# Patient Record
Sex: Male | Born: 1985 | Race: White | Hispanic: No | Marital: Single | Smoking: Never smoker
Health system: Southern US, Community
[De-identification: ages and names within clinical notes are randomized; demographics above are authoritative.]

## PROBLEM LIST (undated history)

## (undated) DIAGNOSIS — Z8489 Family history of other specified conditions: Secondary | ICD-10-CM

## (undated) HISTORY — PX: ESOPHAGOGASTRODUODENOSCOPY: SHX1529

## (undated) HISTORY — DX: Family history of other specified conditions: Z84.89

---
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
## 2010-06-16 ENCOUNTER — Ambulatory Visit
Admission: RE | Admit: 2010-06-16 | Discharge: 2010-06-16 | Disposition: A | Discharge status: Home / Self Care | Payer: Self-pay | Source: Ambulatory Visit | Attending: Occupational Medicine | Admitting: Occupational Medicine

## 2010-06-16 ENCOUNTER — Other Ambulatory Visit: Payer: Self-pay | Admitting: Occupational Medicine

## 2010-06-16 DIAGNOSIS — Z021 Encounter for pre-employment examination: Secondary | ICD-10-CM

## 2013-01-03 IMAGING — CR DG CHEST 1V
1 series · 1 of 1 positions shown · non-contrast
Comparison: None.

CLINICAL DATA: Pre employment exam, nonsmoker, no symptoms.

CHEST - 1 VIEW

[view not recorded]
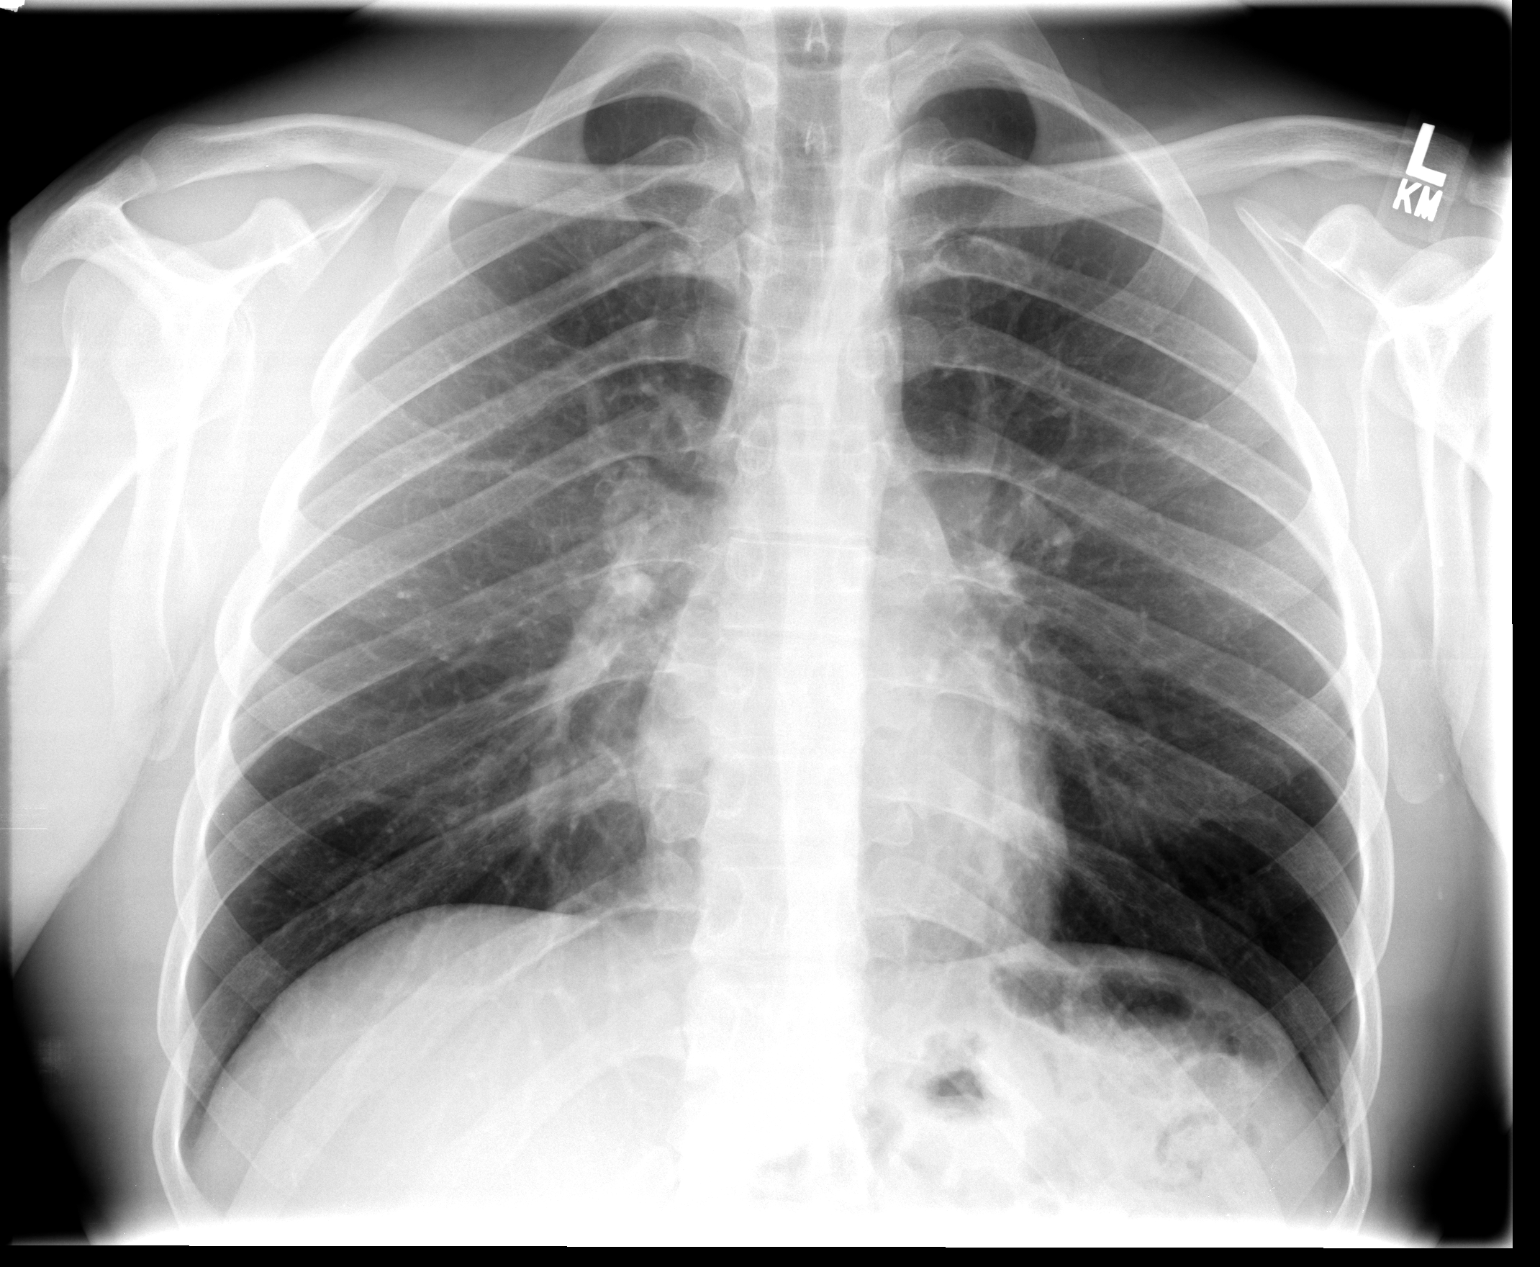

[1 of 1 positions shown; findings below may reference images not displayed]

FINDINGS: Lungs are clear.

Heart size normal.

Mediastinum, hila, pleura and osseous structures appear normal.
IMPRESSION: Normal.

## 2020-02-12 DIAGNOSIS — K223 Perforation of esophagus: Secondary | ICD-10-CM | POA: Diagnosis not present

## 2020-02-12 DIAGNOSIS — T18128A Food in esophagus causing other injury, initial encounter: Secondary | ICD-10-CM | POA: Diagnosis not present

## 2020-02-12 DIAGNOSIS — J029 Acute pharyngitis, unspecified: Secondary | ICD-10-CM | POA: Diagnosis not present

## 2020-02-12 DIAGNOSIS — N281 Cyst of kidney, acquired: Secondary | ICD-10-CM | POA: Diagnosis not present

## 2020-02-12 DIAGNOSIS — K3189 Other diseases of stomach and duodenum: Secondary | ICD-10-CM | POA: Diagnosis not present

## 2020-02-12 DIAGNOSIS — J982 Interstitial emphysema: Secondary | ICD-10-CM | POA: Diagnosis not present

## 2020-02-12 DIAGNOSIS — R109 Unspecified abdominal pain: Secondary | ICD-10-CM | POA: Diagnosis not present

## 2020-02-12 DIAGNOSIS — R188 Other ascites: Secondary | ICD-10-CM | POA: Diagnosis not present

## 2020-02-12 DIAGNOSIS — J90 Pleural effusion, not elsewhere classified: Secondary | ICD-10-CM | POA: Diagnosis not present

## 2020-02-12 DIAGNOSIS — K429 Umbilical hernia without obstruction or gangrene: Secondary | ICD-10-CM | POA: Diagnosis not present

## 2020-02-13 DIAGNOSIS — K920 Hematemesis: Secondary | ICD-10-CM | POA: Diagnosis not present

## 2020-02-13 DIAGNOSIS — J90 Pleural effusion, not elsewhere classified: Secondary | ICD-10-CM | POA: Diagnosis not present

## 2020-02-13 DIAGNOSIS — Z720 Tobacco use: Secondary | ICD-10-CM | POA: Diagnosis not present

## 2020-02-13 DIAGNOSIS — R000 Tachycardia, unspecified: Secondary | ICD-10-CM | POA: Diagnosis not present

## 2020-02-13 DIAGNOSIS — J939 Pneumothorax, unspecified: Secondary | ICD-10-CM | POA: Diagnosis not present

## 2020-02-13 DIAGNOSIS — J948 Other specified pleural conditions: Secondary | ICD-10-CM | POA: Diagnosis not present

## 2020-02-13 DIAGNOSIS — Z792 Long term (current) use of antibiotics: Secondary | ICD-10-CM | POA: Diagnosis not present

## 2020-02-13 DIAGNOSIS — R079 Chest pain, unspecified: Secondary | ICD-10-CM | POA: Diagnosis not present

## 2020-02-13 DIAGNOSIS — R109 Unspecified abdominal pain: Secondary | ICD-10-CM | POA: Diagnosis not present

## 2020-02-13 DIAGNOSIS — R0789 Other chest pain: Secondary | ICD-10-CM | POA: Diagnosis not present

## 2020-02-13 DIAGNOSIS — R6521 Severe sepsis with septic shock: Secondary | ICD-10-CM | POA: Diagnosis not present

## 2020-02-13 DIAGNOSIS — K223 Perforation of esophagus: Secondary | ICD-10-CM | POA: Diagnosis not present

## 2020-02-13 DIAGNOSIS — J9811 Atelectasis: Secondary | ICD-10-CM | POA: Diagnosis not present

## 2020-02-13 DIAGNOSIS — R918 Other nonspecific abnormal finding of lung field: Secondary | ICD-10-CM | POA: Diagnosis not present

## 2020-02-13 DIAGNOSIS — Z20822 Contact with and (suspected) exposure to covid-19: Secondary | ICD-10-CM | POA: Diagnosis not present

## 2020-02-13 DIAGNOSIS — R1013 Epigastric pain: Secondary | ICD-10-CM | POA: Diagnosis not present

## 2020-02-13 DIAGNOSIS — I10 Essential (primary) hypertension: Secondary | ICD-10-CM | POA: Diagnosis not present

## 2020-02-13 DIAGNOSIS — J029 Acute pharyngitis, unspecified: Secondary | ICD-10-CM | POA: Diagnosis not present

## 2020-02-13 DIAGNOSIS — T18128A Food in esophagus causing other injury, initial encounter: Secondary | ICD-10-CM | POA: Diagnosis not present

## 2020-02-13 DIAGNOSIS — J9851 Mediastinitis: Secondary | ICD-10-CM | POA: Diagnosis not present

## 2020-02-13 DIAGNOSIS — R404 Transient alteration of awareness: Secondary | ICD-10-CM | POA: Diagnosis not present

## 2020-02-13 DIAGNOSIS — Z4682 Encounter for fitting and adjustment of non-vascular catheter: Secondary | ICD-10-CM | POA: Diagnosis not present

## 2020-02-13 DIAGNOSIS — Z6825 Body mass index (BMI) 25.0-25.9, adult: Secondary | ICD-10-CM | POA: Diagnosis not present

## 2020-02-13 DIAGNOSIS — N179 Acute kidney failure, unspecified: Secondary | ICD-10-CM | POA: Diagnosis not present

## 2020-02-13 DIAGNOSIS — G8918 Other acute postprocedural pain: Secondary | ICD-10-CM | POA: Diagnosis not present

## 2020-02-13 DIAGNOSIS — A419 Sepsis, unspecified organism: Secondary | ICD-10-CM | POA: Diagnosis not present

## 2020-02-13 DIAGNOSIS — Z0389 Encounter for observation for other suspected diseases and conditions ruled out: Secondary | ICD-10-CM | POA: Diagnosis not present

## 2020-02-13 DIAGNOSIS — J982 Interstitial emphysema: Secondary | ICD-10-CM | POA: Diagnosis not present

## 2020-02-13 DIAGNOSIS — R739 Hyperglycemia, unspecified: Secondary | ICD-10-CM | POA: Diagnosis not present

## 2020-02-25 DIAGNOSIS — K223 Perforation of esophagus: Secondary | ICD-10-CM | POA: Diagnosis not present

## 2020-02-25 DIAGNOSIS — J90 Pleural effusion, not elsewhere classified: Secondary | ICD-10-CM | POA: Diagnosis not present

## 2020-03-03 DIAGNOSIS — K223 Perforation of esophagus: Secondary | ICD-10-CM | POA: Diagnosis not present

## 2020-03-03 DIAGNOSIS — Z6824 Body mass index (BMI) 24.0-24.9, adult: Secondary | ICD-10-CM | POA: Diagnosis not present

## 2020-03-03 DIAGNOSIS — R918 Other nonspecific abnormal finding of lung field: Secondary | ICD-10-CM | POA: Diagnosis not present

## 2020-07-29 DIAGNOSIS — R202 Paresthesia of skin: Secondary | ICD-10-CM | POA: Diagnosis not present

## 2020-08-22 DIAGNOSIS — Z7689 Persons encountering health services in other specified circumstances: Secondary | ICD-10-CM | POA: Diagnosis not present

## 2020-08-22 DIAGNOSIS — Z9889 Other specified postprocedural states: Secondary | ICD-10-CM | POA: Diagnosis not present

## 2020-08-22 DIAGNOSIS — Z6823 Body mass index (BMI) 23.0-23.9, adult: Secondary | ICD-10-CM | POA: Diagnosis not present

## 2020-08-22 DIAGNOSIS — M2560 Stiffness of unspecified joint, not elsewhere classified: Secondary | ICD-10-CM | POA: Diagnosis not present

## 2020-08-22 DIAGNOSIS — J45909 Unspecified asthma, uncomplicated: Secondary | ICD-10-CM | POA: Diagnosis not present

## 2020-08-23 DIAGNOSIS — M2560 Stiffness of unspecified joint, not elsewhere classified: Secondary | ICD-10-CM | POA: Diagnosis not present

## 2020-09-08 DIAGNOSIS — J45909 Unspecified asthma, uncomplicated: Secondary | ICD-10-CM | POA: Diagnosis not present

## 2020-09-08 DIAGNOSIS — Z0001 Encounter for general adult medical examination with abnormal findings: Secondary | ICD-10-CM | POA: Diagnosis not present

## 2020-09-08 DIAGNOSIS — Z6823 Body mass index (BMI) 23.0-23.9, adult: Secondary | ICD-10-CM | POA: Diagnosis not present

## 2020-09-08 DIAGNOSIS — M2560 Stiffness of unspecified joint, not elsewhere classified: Secondary | ICD-10-CM | POA: Diagnosis not present

## 2020-09-08 DIAGNOSIS — Z9889 Other specified postprocedural states: Secondary | ICD-10-CM | POA: Diagnosis not present

## 2020-09-19 DIAGNOSIS — Z20828 Contact with and (suspected) exposure to other viral communicable diseases: Secondary | ICD-10-CM | POA: Diagnosis not present

## 2020-09-19 DIAGNOSIS — J111 Influenza due to unidentified influenza virus with other respiratory manifestations: Secondary | ICD-10-CM | POA: Diagnosis not present

## 2020-10-07 DIAGNOSIS — R051 Acute cough: Secondary | ICD-10-CM | POA: Diagnosis not present

## 2020-10-07 DIAGNOSIS — Z20828 Contact with and (suspected) exposure to other viral communicable diseases: Secondary | ICD-10-CM | POA: Diagnosis not present

## 2022-04-04 ENCOUNTER — Encounter: Payer: Self-pay | Admitting: Gastroenterology

## 2022-04-09 ENCOUNTER — Ambulatory Visit: Payer: Commercial Managed Care - PPO | Admitting: Gastroenterology

## 2022-04-09 ENCOUNTER — Encounter: Payer: Self-pay | Admitting: Gastroenterology

## 2022-04-09 VITALS — BP 130/90 | HR 80 | Ht 70.0 in | Wt 186.1 lb

## 2022-04-09 DIAGNOSIS — R1310 Dysphagia, unspecified: Secondary | ICD-10-CM

## 2022-04-09 NOTE — Progress Notes (Signed)
Chief Complaint: Dysphagia, history of esophageal perforation, GERD   Referring Provider:     Waldemar Dickens, MD   HPI:     Tanner Fischer is a 37 y.o. male referred to the Gastroenterology Clinic for evaluation of dysphagia and GERD.  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  Prior to the perforation, he did have a history of episodic dysphagia to solids for at least 5 years, slowly increasing in frequency and severity over the years and eventually with solids and liquids at times. No prior EGD, PPI, etc.   - 02/13/2020: Distal esophageal perforation and underwent esophageal repair via left thoracotomy with intercostal muscle flaps at [facility] - Dysphagia improved after surgery - 12/2021: Started experiencing more frequent difficulty with solid food. Points to mid/lower sternum - 02/28/2022: CT C/A/P: Evidence of prior esophageal perforation repair with muscle flaps.  Mild diffuse circumferential wall thickening in distal esophagus at GE junction, otherwise normal GI tract - 03/05/2022: EGD by Dr. Waldemar Dickens at [place] [facility]: Normal esophagus, stomach.  No esophageal stricture.  No biopsies/path review  Today, he states sxs are essentially the same.  Still with intermittent solid  > liquid dysphagia.  Has had to regurgitate stuck food back out. No HB or reflux sxs.   Questionable hx of childhood asthma; no asthma now. Does have seasonal/environmental allergies. Mother with asthma.   Aunt with Crohns disease. No Fhx of Colon CA.    History reviewed. No pertinent past medical history.   Past Surgical History:  Procedure Laterality Date   ESOPHAGOGASTRODUODENOSCOPY     History reviewed. No pertinent family history. Social History   Tobacco Use   Smoking status: Never   Smokeless tobacco: Never  Vaping Use   Vaping Use: Never used  Substance Use Topics   Alcohol use: Never   Drug use: Never   No current outpatient medications on  file.   No current facility-administered medications for this visit.   Not on File   Review of Systems: All systems reviewed and negative except where noted in HPI.     Physical Exam:    Wt Readings from Last 3 Encounters:  04/09/22 186 lb 2 oz (84.4 kg)    BP (!) 130/90   Pulse 80   Ht 5\' 10"  (1.778 m)   Wt 186 lb 2 oz (84.4 kg)   BMI 26.71 kg/m  Constitutional:  Pleasant, in no acute distress. Psychiatric: Normal mood and affect. Behavior is normal. Cardiovascular: Normal rate, regular rhythm. No edema Pulmonary/chest: Effort normal and breath sounds normal. No wheezing, rales or rhonchi. Abdominal: Soft, nondistended, nontender. Bowel sounds active throughout. There are no masses palpable. No hepatomegaly. Neurological: Alert and oriented to person place and time. Skin: Skin is warm and dry. No rashes noted.   ASSESSMENT AND PLAN;   1) Dysphagia 2) History of spontaneous esophageal perforation Discussed DDx with the patient today.  I am curious about Eosinophilic Esophagitis given prolonged course of dysphagia, possible history of childhood asthma, and perforation.  Would expect to see some degree of endoscopic changes though, but no images for review and no pathology collected.  We discussed the role/utility of repeat upper endoscopy with biopsies and/or dilation, manometry, trial of medications at length today, and he elected to proceed with the following plan:  - Repeat EGD with esophageal bxs to eval for EoE with dilation as appropriate - If negative/normal, plan for  Esophageal Manometry  - In the meantime, continue cutting food into small pieces, chewing thoroughly, drink plenty of fluids with meals  The indications, risks, and benefits of EGD were explained to the patient in detail. Risks include but are not limited to bleeding, perforation, adverse reaction to medications, and cardiopulmonary compromise. Sequelae include but are not limited to the possibility of  surgery, hospitalization, and mortality. The patient verbalized understanding and wished to proceed. All questions answered, referred to scheduler. Further recommendations pending results of the exam.    Dominic Pea Sharada Albornoz, DO, FACG  04/09/2022, 4:12 PM   No ref. provider found

## 2022-04-09 NOTE — Patient Instructions (Addendum)
You have been scheduled for an endoscopy. Please follow written instructions given to you at your visit today. If you use inhalers (even only as needed), please bring them with you on the day of your procedure.  _______________________________________________________  If your blood pressure at your visit was 140/90 or greater, please contact your primary care physician to follow up on this.  _______________________________________________________  If you are age 81 or younger, your body mass index should be between 19-25. Your Body mass index is 26.71 kg/m. If this is out of the aformentioned range listed, please consider follow up with your Primary Care Provider.   __________________________________________________________  The [place] [facility] providers would like to encourage you to use [facility] to communicate with providers for non-urgent requests or questions.  Due to long hold times on the telephone, sending your provider a message by [facility] may be a faster and more efficient way to get a response.  Please allow 48 business hours for a response.  Please remember that this is for non-urgent requests.   Due to recent changes in healthcare laws, you may see the results of your imaging and laboratory studies on MyChart before your provider has had a chance to review them.  We understand that in some cases there may be results that are confusing or concerning to you. Not all laboratory results come back in the same time frame and the provider may be waiting for multiple results in order to interpret others.  Please give [place] 48 hours in order for your provider to thoroughly review all the results before contacting the office for clarification of your results.    Thank you for choosing me and [facility].  Vito Cirigliano, D.O.

## 2022-04-10 ENCOUNTER — Encounter: Payer: Self-pay | Admitting: Certified Registered Nurse Anesthetist

## 2022-04-11 ENCOUNTER — Ambulatory Visit (AMBULATORY_SURGERY_CENTER): Payer: Commercial Managed Care - PPO | Admitting: Gastroenterology

## 2022-04-11 ENCOUNTER — Encounter: Payer: Self-pay | Admitting: Gastroenterology

## 2022-04-11 VITALS — BP 112/65 | HR 73 | Temp 97.8°F | Resp 12 | Ht 70.0 in | Wt 186.0 lb

## 2022-04-11 DIAGNOSIS — K2100 Gastro-esophageal reflux disease with esophagitis, without bleeding: Secondary | ICD-10-CM | POA: Diagnosis present

## 2022-04-11 DIAGNOSIS — R1310 Dysphagia, unspecified: Secondary | ICD-10-CM

## 2022-04-11 MED ORDER — SODIUM CHLORIDE 0.9 % IV SOLN: 500.0000 mL | Freq: Once | INTRAVENOUS | Status: AC

## 2022-04-11 NOTE — Progress Notes (Signed)
Pt's states no medical or surgical changes since previsit or office visit. 

## 2022-04-11 NOTE — Progress Notes (Signed)
Called to room to assist during endoscopic procedure.  Patient ID and intended procedure confirmed with present staff. Received instructions for my participation in the procedure from the performing physician.

## 2022-04-11 NOTE — Progress Notes (Signed)
   GASTROENTEROLOGY PROCEDURE H&P NOTE   Primary Care Physician: Pcp, No    Reason for Procedure:   Dysphagia  Plan:    EGD  Patient is appropriate for endoscopic procedure(s) in the ambulatory ([place]) setting.  The nature of the procedure, as well as the risks, benefits, and alternatives were carefully and thoroughly reviewed with the patient. Ample time for discussion and questions allowed. The patient understood, was satisfied, and agreed to proceed.     HPI: Tanner Fischer is a 37 y.o. male who presents for EGD for evaluation of dysphagia, and prior hx of spontaneous esophageal perforation in 01/2020.  Patient was most recently seen in the Gastroenterology Clinic on 04/09/2022 by me.  No interval change in medical history since that appointment. Please refer to that note for full details regarding GI history and clinical presentation.   No past medical history on file.  Past Surgical History:  Procedure Laterality Date   ESOPHAGOGASTRODUODENOSCOPY      Prior to Admission medications   Not on File    No current outpatient medications on file.   Current Facility-Administered Medications  Medication Dose Route Frequency Provider Last Rate Last Admin   0.9 %  sodium chloride infusion  500 mL Intravenous Once [place] Viramontes V, DO        Allergies as of 04/11/2022   (Not on File)    No family history on file.  Social History   Socioeconomic History   Marital status: Single    Spouse name: Not on file   Number of children: Not on file   Years of education: Not on file   Highest education level: Not on file  Occupational History   Not on file  Tobacco Use   Smoking status: Never   Smokeless tobacco: Never  Vaping Use   Vaping Use: Never used  Substance and Sexual Activity   Alcohol use: Never   Drug use: Never   Sexual activity: Not on file  Other Topics Concern   Not on file  Social History Narrative   Not on file   Social Determinants of Health    Financial Resource Strain: Not on file  Food Insecurity: Not on file  Transportation Needs: Not on file  Physical Activity: Not on file  Stress: Not on file  Social Connections: Not on file  Intimate Partner Violence: Not on file    Physical Exam: Vital signs in last 24 hours: @BP  116/67   Pulse 74   Ht 5\' 10"  (1.778 m)   Wt 186 lb (84.4 kg)   SpO2 98%   BMI 26.69 kg/m  GEN: NAD EYE: Sclerae anicteric ENT: MMM CV: Non-tachycardic Pulm: CTA b/l GI: Soft, NT/ND NEURO:  Alert & Oriented x [place], DO [facility]   04/11/2022 2:11 PM

## 2022-04-11 NOTE — Patient Instructions (Signed)
Resume all of your previous medications today.  Read all of the handouts given to you by your recovery room nurse.  YOU HAD AN ENDOSCOPIC PROCEDURE TODAY AT [facility]:   Refer to the procedure report that was given to you for any specific questions about what was found during the examination.  If the procedure report does not answer your questions, please call your gastroenterologist to clarify.  If you requested that your care partner not be given the details of your procedure findings, then the procedure report has been included in a sealed envelope for you to review at your convenience later.  YOU SHOULD EXPECT: Some feelings of bloating in the abdomen. Passage of more gas than usual.  Walking can help get rid of the air that was put into your GI tract during the procedure and reduce the bloating.   Please Note:  You might notice some irritation and congestion in your nose or some drainage.  This is from the oxygen used during your procedure.  There is no need for concern and it should clear up in a day or so.    Following upper endoscopy (EGD)  Vomiting of blood or coffee ground material  New chest pain or pain under the shoulder blades  Painful or persistently difficult swallowing  New shortness of breath  Fever of 100F or higher  Black, tarry-looking stools  For urgent or emergent issues, a gastroenterologist can be reached at any hour by calling [phone]. Do not use MyChart messaging for urgent concerns.    DIET:  We do recommend a small meal at first, but then you may proceed to your regular diet.  Drink plenty of fluids but you should avoid alcoholic beverages for 24 hours.  ACTIVITY:  You should plan to take it easy for the rest of today and you should NOT DRIVE or use heavy machinery until tomorrow (because of the sedation medicines used during the test).    FOLLOW UP: Our staff will call the number listed on your records the next business day following  your procedure.  We will call around 7:15- 8:00 am to check on you and address any questions or concerns that you may have regarding the information given to you following your procedure. If we do not reach you, we will leave a message.     If any biopsies were taken you will be contacted by phone or by letter within the next 1-3 weeks.  Please call us at [phone] if you have not heard about the biopsies in 3 weeks.    SIGNATURES/CONFIDENTIALITY: You and/or your care partner have signed paperwork which will be entered into your electronic medical record.  These signatures attest to the fact that that the information above on your After Visit Summary has been reviewed and is understood.  Full responsibility of the confidentiality of this discharge information lies with you and/or your care-partner.

## 2022-04-11 NOTE — Progress Notes (Signed)
1447 Robinul 0.1 mg IV given due large amount of secretions upon assessment.  MD made aware, vss  ?

## 2022-04-11 NOTE — Op Note (Signed)
[facility] Patient Name: Tanner Fischer Procedure Date: 04/11/2022 2:46 PM MRN: [id number] Endoscopist: Doristine Locks , MD, [id number] Age: 37 Referring MD:  Date of Birth: Feb 21, 1986 Gender: Male Account #: [id number] Procedure:                Upper GI endoscopy w/ biopsy Indications:              Dysphagia Medicines:                Monitored Anesthesia Care Procedure:                Pre-Anesthesia Assessment:                           - Prior to the procedure, a History and Physical                            was performed, and patient medications and                            allergies were reviewed. The patient's tolerance of                            previous anesthesia was also reviewed. The risks                            and benefits of the procedure and the sedation                            options and risks were discussed with the patient.                            All questions were answered, and informed consent                            was obtained. Prior Anticoagulants: The patient has                            taken no anticoagulant or antiplatelet agents. ASA                            Grade Assessment: II - A patient with mild systemic                            disease. After reviewing the risks and benefits,                            the patient was deemed in satisfactory condition to                            undergo the procedure.                           After obtaining informed consent, the endoscope was  passed under direct vision. Throughout the                            procedure, the patient's blood pressure, pulse, and                            oxygen saturations were monitored continuously. The                            Endoscope was introduced through the mouth, and                            advanced to the third part of duodenum. The upper                            GI endoscopy was accomplished  without difficulty.                            The patient tolerated the procedure well. Scope In: Scope Out: Findings:                 The examined esophagus was normal. There was a                            subtle area of mucosal variance in the distal                            esophagus, which is likely from the prior surgery.                            There was otherwise no stricture that warranted                            endoscopic dilation. Biopsies were obtained from                            the proximal and distal esophagus with cold forceps                            for histology of suspected eosinophilic                            esophagitis. Estimated blood loss was minimal.                           The Z-line was regular and was found 40 cm from the                            incisors.                           The entire examined stomach was normal.                           The  examined duodenum was normal. Biopsies were                            taken with a cold forceps for histology. Estimated                            blood loss was minimal. Complications:            No immediate complications. Estimated Blood Loss:     Estimated blood loss was minimal. Impression:               - Normal esophagus. Biopsies were taken with a cold                            forceps for evaluation of eosinophilic esophagitis.                           - Z-line regular, 40 cm from the incisors.                           - Normal stomach.                           - Normal examined duodenum. Biopsied. Recommendation:           - Patient has a contact number available for                            emergencies. The signs and symptoms of potential                            delayed complications were discussed with the                            patient. Return to normal activities tomorrow.                            Written discharge instructions were provided to the                             patient.                           - Resume previous diet.                           - Continue present medications.                           - Await pathology results.                           - If the pathology is normal and ongoing symptoms,                            will plan on further evaluation with esophageal  manometry.                           - Return to GI clinic at appointment to be                            scheduled. Gerrit Heck, MD 04/11/2022 3:09:15 PM

## 2022-04-11 NOTE — Progress Notes (Signed)
Report given to PACU, vss 

## 2022-04-12 ENCOUNTER — Telehealth: Payer: Self-pay | Admitting: *Deleted

## 2022-04-12 NOTE — Telephone Encounter (Signed)
Attempted f/u phone call. No answer. Mailbox full, unable to leave message.

## 2022-04-16 ENCOUNTER — Telehealth: Payer: Self-pay

## 2022-04-16 NOTE — Telephone Encounter (Signed)
Return GI clinic appt scheduled on 05/16/22 at 8:40 as requested on 04/11/22 EGD report. Letter sent to [facility] and mailed.

## 2022-04-23 ENCOUNTER — Telehealth: Payer: Self-pay | Admitting: Pharmacy Technician

## 2022-04-23 ENCOUNTER — Other Ambulatory Visit (HOSPITAL_COMMUNITY): Payer: Self-pay

## 2022-04-23 ENCOUNTER — Other Ambulatory Visit: Payer: Self-pay

## 2022-04-23 MED ORDER — PANTOPRAZOLE SODIUM 40 MG PO TBEC: 40.0000 mg | DELAYED_RELEASE_TABLET | Freq: Two times a day (BID) | ORAL | 0 refills | Status: DC

## 2022-04-23 NOTE — Telephone Encounter (Signed)
Patient Advocate Encounter  Received notification from [employer] that prior authorization for PANTOPRAZOLE 40MG  is required.   PA submitted on 2.5.24 Key [id number] Status is pending

## 2022-04-27 NOTE — Telephone Encounter (Signed)
Patient Advocate Encounter  Prior Authorization for PANTOPRAZOLE 40MG has been approved.    PA# [id number] Effective dates: 2.5.24 through 2.25.25

## 2022-05-16 ENCOUNTER — Ambulatory Visit: Payer: Commercial Managed Care - PPO | Admitting: Gastroenterology

## 2022-05-16 ENCOUNTER — Encounter: Payer: Self-pay | Admitting: Gastroenterology

## 2022-05-16 VITALS — BP 128/78 | HR 84 | Ht 70.0 in | Wt 189.1 lb

## 2022-05-16 DIAGNOSIS — K219 Gastro-esophageal reflux disease without esophagitis: Secondary | ICD-10-CM

## 2022-05-16 DIAGNOSIS — R1310 Dysphagia, unspecified: Secondary | ICD-10-CM | POA: Diagnosis not present

## 2022-05-16 MED ORDER — PANTOPRAZOLE SODIUM 40 MG PO TBEC: 40.0000 mg | DELAYED_RELEASE_TABLET | Freq: Every day | ORAL | 3 refills | Status: DC

## 2022-05-16 NOTE — Progress Notes (Signed)
Chief Complaint:    Dysphagia, procedure follow-up  GI History: 37 year old male with a history of esophageal perforation 01/2020 as outlined below.  Prior to the perforation, he did have a history of episodic dysphagia to solids for at least 5 years, slowly increasing in frequency and severity over the years and eventually with solids and liquids at times. No prior EGD, PPI, etc.    - 02/13/2020: Distal esophageal perforation and underwent esophageal repair via left thoracotomy with intercostal muscle flaps at [facility] - Dysphagia improved after surgery - 12/2021: Started experiencing more frequent difficulty with solid food. Points to mid/lower sternum - 02/28/2022: CT C/A/P: Evidence of prior esophageal perforation repair with muscle flaps.  Mild diffuse circumferential wall thickening in distal esophagus at GE junction, otherwise normal GI tract - 03/05/2022: EGD by Dr. Waldemar Dickens at [place] [facility]: Normal esophagus, stomach.  No esophageal stricture.  No biopsies/path review - 04/09/2022: Initial appointment in GI clinic for continued intermittent solid  > liquid dysphagia.  Has had to regurgitate stuck food back out. No HB or reflux sxs.  - 04/11/2022: EGD: Subtle area of mucosal variance in the distal esophagus likely from prior surgery, otherwise normal esophagus (path: Rare eosinophils in the distal esophagus, in the proximal esophagus.  Chronic esophagitis consistent with reflux changes, but no e/o EoE).  Normal stomach and duodenum (duodenum path benign).  Started on Protonix 40 mg twice daily x 4 weeks for diagnostic and therapeutic intent, with plan to wean    HPI:     Patient is a 37 y.o. male presenting to the Gastroenterology Clinic for follow-up.  EGD completed last month as outlined above.  Was started on high-dose PPI.  States he has had a noticeable improvement in dysphagia with BID PPI.  Review of systems:     No chest pain, no SOB, no fevers, no urinary sx   Past Medical  History:  Diagnosis Date   Family history of malignant hyperthermia    uncle    [name] surgical history, family medical history, social history, medications and allergies were all reviewed in Epic    Current Outpatient Medications  Medication Sig Dispense Refill   pantoprazole (PROTONIX) 40 MG tablet Take 1 tablet (40 mg total) by mouth 2 (two) times daily. Take 30-60 minutes prior to breakfast and dinner. 60 tablet 0   Current Facility-Administered Medications  Medication Dose Route Frequency Provider Last Rate Last Admin   0.9 %  sodium chloride infusion  500 mL Intravenous Once Kasem Mozer V, DO        Physical Exam:     BP 128/78 (BP Location: Left Arm, Patient Position: Sitting, Cuff Size: Normal)   Pulse 84   Ht '5\' 10"'$  (1.778 m)   Wt 189 lb 2 oz (85.8 kg)   BMI 27.14 kg/m   GENERAL:  Pleasant male in NAD PSYCH: : Cooperative, normal affect Musculoskeletal:  Normal muscle tone, normal strength NEURO: Alert and oriented x 3, no focal neurologic deficits   IMPRESSION and PLAN:    1) Dysphagia 2) History of spontaneous esophageal perforation 3) Reflux EGD without any evidence of esophageal stricture or EoE, biopsies did not demonstrate reflux inflammatory changes.  He has since had a robust response to trial of high-dose PPI, at least suggestive of reflux burnout.  Given his significant clinical response, plan to continue with medical management for now.  - Can reduce Protonix to 40 mg daily starting next week.  - I have asked him to update  me in 3-4 weeks via phone or [place]. If still improving, plan to continue with PPI and potentially reduce to 20 mg daily in 2-3 months, then titrate off if complete resolution of symptoms - If incomplete response or return of sxs, will plan for EM +/- pH/Impedance testing - RTC in 1 year or sooner prn          Lavena Bullion ,DO, FACG 05/16/2022, 9:12 AM

## 2022-05-16 NOTE — Patient Instructions (Addendum)
We have sent the following medications to your pharmacy for you to pick up at your convenience: Protonix 40 MG   Please follow up in 1 Year. Give [place] a call at [phone] to schedule an appointment.   _______________________________________________________  If your blood pressure at your visit was 140/90 or greater, please contact your primary care physician to follow up on this.  _______________________________________________________ If you are age 38 or younger, your body mass index should be between 19-25. Your Body mass index is 27.14 kg/m. If this is out of the aformentioned range listed, please consider follow up with your Primary Care Provider.   __________________________________________________________  The [place] [facility] providers would like to encourage you to use [facility] to communicate with providers for non-urgent requests or questions.  Due to long hold times on the telephone, sending your provider a message by [facility] may be a faster and more efficient way to get a response.  Please allow 48 business hours for a response.  Please remember that this is for non-urgent requests.    Due to recent changes in healthcare laws, you may see the results of your imaging and laboratory studies on MyChart before your provider has had a chance to review them.  We understand that in some cases there may be results that are confusing or concerning to you. Not all laboratory results come back in the same time frame and the provider may be waiting for multiple results in order to interpret others.  Please give [place] 48 hours in order for your provider to thoroughly review all the results before contacting the office for clarification of your results.    Thank you for choosing me and [facility].  Vito Cirigliano, D.O.

## 2022-05-22 ENCOUNTER — Telehealth: Payer: Self-pay

## 2022-05-22 ENCOUNTER — Other Ambulatory Visit: Payer: Self-pay | Admitting: Gastroenterology

## 2022-05-22 NOTE — Telephone Encounter (Signed)
[facility] wanted [place] to confirm the directions on Alessandro's pantoprazole. In his January office note said he could cut it to daily the week following his visit. I will let [facility] know.

## 2023-04-10 ENCOUNTER — Encounter: Payer: Self-pay | Admitting: Gastroenterology

## 2023-10-04 ENCOUNTER — Encounter: Payer: Self-pay | Admitting: Advanced Practice Midwife
